# Patient Record
Sex: Female | Born: 1990 | Hispanic: No | Smoking: Never smoker
Health system: Southern US, Community
[De-identification: ages and names within clinical notes are randomized; demographics above are authoritative.]

## PROBLEM LIST (undated history)

## (undated) DIAGNOSIS — K4090 Unilateral inguinal hernia, without obstruction or gangrene, not specified as recurrent: Secondary | ICD-10-CM

## (undated) DIAGNOSIS — S43006A Unspecified dislocation of unspecified shoulder joint, initial encounter: Secondary | ICD-10-CM

## (undated) DIAGNOSIS — N83209 Unspecified ovarian cyst, unspecified side: Secondary | ICD-10-CM

## (undated) HISTORY — DX: Unilateral inguinal hernia, without obstruction or gangrene, not specified as recurrent: K40.90

## (undated) HISTORY — DX: Unspecified ovarian cyst, unspecified side: N83.209

---
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
## 2011-01-31 ENCOUNTER — Encounter: Payer: Self-pay | Admitting: Physician Assistant

## 2012-04-10 ENCOUNTER — Encounter: Payer: BC Managed Care – PPO | Admitting: Obstetrics and Gynecology

## 2015-10-16 ENCOUNTER — Other Ambulatory Visit: Payer: Self-pay | Admitting: Emergency Medicine

## 2015-10-16 DIAGNOSIS — N83292 Other ovarian cyst, left side: Secondary | ICD-10-CM

## 2015-10-26 ENCOUNTER — Inpatient Hospital Stay: Admission: RE | Admit: 2015-10-26 | Payer: Self-pay | Source: Ambulatory Visit

## 2015-11-02 ENCOUNTER — Ambulatory Visit
Admission: RE | Admit: 2015-11-02 | Discharge: 2015-11-02 | Disposition: A | Discharge status: Home / Self Care | Payer: BLUE CROSS/BLUE SHIELD | Source: Ambulatory Visit | Attending: Emergency Medicine | Admitting: Emergency Medicine

## 2015-11-02 DIAGNOSIS — N83292 Other ovarian cyst, left side: Secondary | ICD-10-CM

## 2015-11-02 MED ORDER — IOPAMIDOL (ISOVUE-300) INJECTION 61%
100.0000 mL | Freq: Once | INTRAVENOUS | Status: AC | PRN
  Administered 2015-11-02: 100 mL via INTRAVENOUS

## 2017-04-16 ENCOUNTER — Emergency Department (HOSPITAL_BASED_OUTPATIENT_CLINIC_OR_DEPARTMENT_OTHER)
Admission: EM | Admit: 2017-04-16 | Discharge: 2017-04-16 | Disposition: A | Discharge status: Home / Self Care | Payer: BLUE CROSS/BLUE SHIELD | Attending: Physician Assistant | Admitting: Physician Assistant

## 2017-04-16 ENCOUNTER — Encounter (HOSPITAL_BASED_OUTPATIENT_CLINIC_OR_DEPARTMENT_OTHER): Payer: Self-pay

## 2017-04-16 DIAGNOSIS — S43005A Unspecified dislocation of left shoulder joint, initial encounter: Secondary | ICD-10-CM | POA: Insufficient documentation

## 2017-04-16 DIAGNOSIS — X58XXXA Exposure to other specified factors, initial encounter: Secondary | ICD-10-CM | POA: Diagnosis not present

## 2017-04-16 DIAGNOSIS — Y929 Unspecified place or not applicable: Secondary | ICD-10-CM | POA: Insufficient documentation

## 2017-04-16 DIAGNOSIS — Y9389 Activity, other specified: Secondary | ICD-10-CM | POA: Diagnosis not present

## 2017-04-16 DIAGNOSIS — F17200 Nicotine dependence, unspecified, uncomplicated: Secondary | ICD-10-CM | POA: Insufficient documentation

## 2017-04-16 DIAGNOSIS — S4992XA Unspecified injury of left shoulder and upper arm, initial encounter: Secondary | ICD-10-CM | POA: Diagnosis present

## 2017-04-16 DIAGNOSIS — Y990 Civilian activity done for income or pay: Secondary | ICD-10-CM | POA: Insufficient documentation

## 2017-04-16 HISTORY — DX: Unspecified dislocation of unspecified shoulder joint, initial encounter: S43.006A

## 2017-04-16 MED ORDER — FENTANYL CITRATE (PF) 100 MCG/2ML IJ SOLN
50.0000 ug | Freq: Once | INTRAMUSCULAR | Status: AC
  Administered 2017-04-16: 50 ug via NASAL
  Filled 2017-04-16: qty 2

## 2017-04-16 MED ORDER — ONDANSETRON HCL 4 MG/2ML IJ SOLN
4.0000 mg | Freq: Once | INTRAMUSCULAR | Status: DC
  Filled 2017-04-16: qty 2

## 2017-04-16 MED ORDER — LIDOCAINE HCL (PF) 1 % IJ SOLN
30.0000 mL | Freq: Once | INTRAMUSCULAR | Status: AC
  Administered 2017-04-16: 30 mL via INTRADERMAL
  Filled 2017-04-16: qty 30

## 2017-04-16 MED ORDER — MORPHINE SULFATE (PF) 4 MG/ML IV SOLN
4.0000 mg | Freq: Once | INTRAVENOUS | Status: DC
  Filled 2017-04-16: qty 1

## 2017-04-16 MED ORDER — ONDANSETRON 4 MG PO TBDP
ORAL_TABLET | ORAL | Status: AC
  Filled 2017-04-16: qty 1

## 2017-04-16 MED ORDER — ONDANSETRON 4 MG PO TBDP
4.0000 mg | ORAL_TABLET | Freq: Once | ORAL | Status: AC
  Administered 2017-04-16: 4 mg via ORAL
  Filled 2017-04-16: qty 1

## 2017-04-16 NOTE — ED Notes (Signed)
ED Provider at bedside. 

## 2017-04-16 NOTE — ED Triage Notes (Signed)
C/o pain to left shoulder from pushing door into work-hx of shoulder dislocation-pt has swath in place-stats EMS placed-slow steady gait

## 2017-04-16 NOTE — ED Provider Notes (Signed)
[facility] Provider Note   CSN: [id number] Arrival date & time: 04/16/17  1634  By signing my name below, I, Rosana Fret, attest that this documentation has been prepared under the direction and in the presence of Berkleigh Beckles, Cindee Salt, MD. Electronically Signed: Rosana Fret, ED Scribe. 04/16/17. 6:18 PM.   History   Chief Complaint Chief Complaint  Patient presents with  . Shoulder Injury    HPI HPI Comments: Cheryl Estrada is a 26 y.o. female with a PMHx of left shoulder dislocation who presents to the Emergency Department complaining of sudden onset, moderate left shoulder pain onset just prior to arrival. Pt states she was at work when she pushed open a door and hurt her shoulder. Pt states pain is exacerbated by movement. No treatments tried prior to arrival in the ED. Pt denies numbness, tingling, or any other complaints at this time.  Past Medical History:  Diagnosis Date  . Shoulder dislocation     There are no active problems to display for this patient.   History reviewed. No pertinent surgical history.  OB History    No data available       Home Medications    Prior to Admission medications   Not on File    Family History No family history on file.  Social History Social History  Substance Use Topics  . Smoking status: Current Some Day Smoker  . Smokeless tobacco: Never Used  . Alcohol use Yes     Comment: occ     Allergies   Doxycycline   Review of Systems Review of Systems  Musculoskeletal: Positive for arthralgias and myalgias.  Neurological: Negative for numbness.  All other systems reviewed and are negative.    Physical Exam Updated Vital Signs BP (!) 108/56 (BP Location: Right Arm)   Pulse 79   Temp 98.2 F (36.8 C) (Oral)   Resp 20   Ht 5\' 4"  (1.626 m)   Wt 101 lb (45.8 kg)   LMP 04/14/2017   SpO2 100%   BMI 17.34 kg/m   Physical Exam  Constitutional: She is oriented to person, place, and  time. She appears well-developed and well-nourished.  HENT:  Head: Normocephalic and atraumatic.  Cardiovascular: Normal rate, regular rhythm and normal heart sounds.  Exam reveals no gallop and no friction rub.   No murmur heard. Pulmonary/Chest: Effort normal and breath sounds normal. No respiratory distress. She has no wheezes.  Musculoskeletal: She exhibits deformity.  Left shoulder deformity. Distal sensation and strength intact. ROM limited due to pain.   Neurological: She is alert and oriented to person, place, and time.  Skin: Skin is warm and dry.  Psychiatric: She has a normal mood and affect.  Nursing note and vitals reviewed.    ED Treatments / Results  DIAGNOSTIC STUDIES: Oxygen Saturation is 100% on RA, normal by my interpretation.   COORDINATION OF CARE: 6:14PM-Discussed next steps with pt including pain management with OTC medicine, rest and ice at home. Pt verbalized understanding and is agreeable with the plan.   Labs (all labs ordered are listed, but only abnormal results are displayed) Labs Reviewed - No data to display  EKG  EKG Interpretation None       Radiology No results found.  Procedures Procedures (including critical care time)  Medications Ordered in ED Medications  morphine 4 MG/ML injection 4 mg (not administered)  ondansetron (ZOFRAN) injection 4 mg (not administered)  ondansetron (ZOFRAN-ODT) 4 MG disintegrating tablet (not administered)  lidocaine (  PF) (XYLOCAINE) 1 % injection 30 mL (30 mLs Intradermal Given 04/16/17 1744)  ondansetron (ZOFRAN-ODT) disintegrating tablet 4 mg (4 mg Oral Given 04/16/17 1744)  fentaNYL (SUBLIMAZE) injection 50 mcg (50 mcg Nasal Given 04/16/17 1740)     Initial Impression / Assessment and Plan / ED Course  I have reviewed the triage vital signs and the nursing notes.  Pertinent labs & imaging results that were available during my care of the patient were reviewed by me and considered in my medical  decision making (see chart for details).    I personally performed the services described in this documentation, which was scribed in my presence. The recorded information has been reviewed and is accurate.    Patient is a very pleasant 26 year old female who has a shoulder dislocation of the left. Patient was pushing the door open when she her shoulder dislocated. Usually she can relocate it without an issue. This happened several times in the past. This time she was unable to. On arrival patient was anxious, having carpal spasms. Unable to get IV. Given intranasal. Patient felt much better. Patient's shoulder [facility].  We'll have her follow with orthopedics.    Reduction of dislocation Date/Time: 6:21 PM Performed by: Arlana Hoveourteney L Alinda Egolf Authorized by: Arlana Hoveourteney L Pamela Intrieri Consent: Verbal consent obtained. Risks and benefits: risks, benefits and alternatives were discussed Consent given by: patient Required items: required blood products, implants, devices, and special equipment available Time out: Immediately prior to procedure a "time out" was called to verify the correct patient, procedure, equipment, support staff and site/side marked as required.  Patient sedated: , intranasal fentyl  Vitals: Vital signs were monitored during sedation. Patient tolerance: Patient tolerated the procedure well with no immediate complications. Joint: L shoulder Reduction technique: Tomasa Randunningham    Final Clinical Impressions(s) / ED Diagnoses   Final diagnoses:  None    New Prescriptions New Prescriptions   No medications on file     Abelino DerrickMackuen, Charde Macfarlane Lyn, MD 04/16/17 Rickey [id number]
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
## 2020-03-23 ENCOUNTER — Encounter: Payer: Self-pay | Admitting: Certified Nurse Midwife

## 2020-03-23 ENCOUNTER — Other Ambulatory Visit: Payer: Self-pay

## 2020-03-23 ENCOUNTER — Other Ambulatory Visit (HOSPITAL_COMMUNITY)
Admission: RE | Admit: 2020-03-23 | Discharge: 2020-03-23 | Disposition: A | Discharge status: Home / Self Care | Payer: No Typology Code available for payment source | Source: Ambulatory Visit | Attending: Certified Nurse Midwife | Admitting: Certified Nurse Midwife

## 2020-03-23 ENCOUNTER — Ambulatory Visit (INDEPENDENT_AMBULATORY_CARE_PROVIDER_SITE_OTHER): Payer: PRIVATE HEALTH INSURANCE | Admitting: Certified Nurse Midwife

## 2020-03-23 VITALS — BP 124/78 | HR 114 | Ht 64.0 in | Wt 109.0 lb

## 2020-03-23 DIAGNOSIS — Z113 Encounter for screening for infections with a predominantly sexual mode of transmission: Secondary | ICD-10-CM | POA: Diagnosis not present

## 2020-03-23 DIAGNOSIS — N6009 Solitary cyst of unspecified breast: Secondary | ICD-10-CM | POA: Diagnosis not present

## 2020-03-23 DIAGNOSIS — Z01419 Encounter for gynecological examination (general) (routine) without abnormal findings: Secondary | ICD-10-CM | POA: Diagnosis not present

## 2020-03-23 DIAGNOSIS — K4090 Unilateral inguinal hernia, without obstruction or gangrene, not specified as recurrent: Secondary | ICD-10-CM | POA: Diagnosis not present

## 2020-03-23 DIAGNOSIS — A549 Gonococcal infection, unspecified: Secondary | ICD-10-CM

## 2020-03-23 DIAGNOSIS — A5901 Trichomonal vulvovaginitis: Secondary | ICD-10-CM

## 2020-03-23 NOTE — Progress Notes (Signed)
New patient in the office for annual. Pt is unsure of last pap Pt has concerns about lump in left breast and a hernia in the left pelvic area. Desires full std panel, declines BC GAD-7= 6

## 2020-03-23 NOTE — Progress Notes (Addendum)
GYNECOLOGY ANNUAL PREVENTATIVE CARE ENCOUNTER NOTE  History:     Cheryl Estrada is a 29 y.o. No obstetric history on file. female here for a routine annual gynecologic exam.  Current complaints:   Starting 2-3 months ago the pt reports she began bleeding after penetration during intercourse. Bleeding lasted 1-2 weeks. She says it was heavier than her menstrual period but was not painful or accompanied by other symtpoms. It has not happened after other activities. Pt denies any GU changes including urinary urgency, smell, color, or sensation.  Pt reports she felt a mass in her left breast that appeared two months ago. The mass is not tender and she has observed no changes with menstruation. There is no discharge.    She also reports a right ovarian cyst that was first discovered over 10 years ago. The cyst is asymptomatic and is not painful.  Pt reports a left-sided inguinal hernia that developed in 2016. She is unsure whether it has grown in size but reports it is asymptomatic.  Denies abnormal vaginal discharge, pelvic pain, or other gynecologic concerns.     Gynecologic History Patient's last menstrual period was 03/02/2020. Contraception: none Last Pap: Pt reports it was possibly 2015. Results were: normal with negative HPV  Obstetric History OB History  No obstetric history on file.    Past Medical History:  Diagnosis Date  . Shoulder dislocation     History reviewed. No pertinent surgical history.  No current outpatient medications on file prior to visit.   No current facility-administered medications on file prior to visit.    Allergies  Allergen Reactions  . Doxycycline Swelling    Social History:  reports that she has been smoking. She has never used smokeless tobacco. She reports current alcohol use. She reports current drug use. Drug: Marijuana. Household: Pt and partner. Pt reports she feels safe at home. 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   Family History  Problem Relation Age of Onset  . Breast cancer Maternal Grandmother   . Breast cancer Cousin     The following portions of the patient's history were reviewed and updated as appropriate: allergies, current medications, past family history, past medical history, past social history, past surgical history and problem list.  Review of Systems Pertinent items noted in HPI and remainder of comprehensive ROS otherwise negative.  Physical Exam:  BP 124/78   Pulse (!) 114   Ht 5\' 4"  (1.626 m)   Wt 109 lb (49.4 kg)   LMP 03/02/2020   BMI 18.71 kg/m  CONSTITUTIONAL: Well-developed, well-nourished female in no acute distress.  HENT:  Normocephalic, atraumatic, External right and left ear normal. Oropharynx is clear and moist EYES: Conjunctivae and EOM are normal. Pupils are equal, round, and reactive to light. No scleral icterus.  NECK: Normal range of motion, supple, no masses.  Normal thyroid.  SKIN: Skin is warm and dry. No rash noted. Not diaphoretic. No erythema. No pallor. MUSCULOSKELETAL: Normal range of motion. No tenderness.  No cyanosis, clubbing, or edema.  2+ distal pulses. NEUROLOGIC: Alert and oriented to person, place, and time. Normal reflexes, muscle tone coordination.  PSYCHIATRIC: Normal mood and affect. Normal behavior. Normal judgment and thought content. CARDIOVASCULAR: Normal heart rate noted, regular rhythm RESPIRATORY: Clear to auscultation bilaterally. Effort and breath sounds normal, no problems with respiration noted. BREASTS: Symmetric in size. Performed by 05/02/2020 CNM, please see her note for more detail. Bilateral masses noted. Right mass on the lateral breast  side. Left mass located lateral and inferior to nipple.  ABDOMEN: Soft, no distention noted.  No tenderness, rebound or guarding.  PELVIC: Normal appearing external genitalia and urethral meatus; Left inguinal hernia noted, normal appearing vaginal mucosa and cervix.  No  abnormal discharge noted.  Pap smear obtained by Darrol Poke CNM, please see her note for additional detail.   Assessment and Plan:    1. Encounter for annual routine gynecological examination - Pt reporting to establish care and annual. Reports no health concerns except for those listed.  - Cytology - PAP( [place])  2. Screening for STDs (sexually transmitted diseases) - Cervicovaginal ancillary only( [place]) - HIV antibody (with reflex) - RPR - Hepatitis C Antibody - Hepatitis B Surface AntiGEN  3. Cystic lump of breast - Bilateral masses observed:right lateral breast, and left lateral, inferior by the nipple.  - Pt referred for Mammary U/S for additional imaging and dx.  4. Inguinal hernia of left side without obstruction or gangrene - Pt referred to general surgery for tx.   Will follow up results of pap smear and manage accordingly. Mammogram scheduled Routine preventative health maintenance measures emphasized. Please refer to After Visit Summary for other counseling recommendations.      Shawna Orleans, [employer]  Attestation of Supervision of Student:  I confirm that I have verified the information documented in the medical student's note and that I have also personally reperformed the history, physical exam and all medical decision making activities.  I have verified that all services and findings are accurately documented in this student's note; and I agree with management and plan as outlined in the documentation. I have also made any necessary editorial changes.  Physical Exam:  BP 124/78   Pulse (!) 114   Ht 5\' 4"  (1.626 m)   Wt 109 lb (49.4 kg)   LMP 03/02/2020   BMI 18.71 kg/m  CONSTITUTIONAL: Well-developed, well-nourished female in no acute distress.  HENT:  Normocephalic, atraumatic, External right and left ear normal. Oropharynx is clear and moist EYES: Conjunctivae and EOM are normal. Pupils are equal, round, and reactive to  light. NECK: Normal range of motion, supple, no masses.  Normal thyroid.  SKIN: Skin is warm and dry. No rash noted. Not diaphoretic. No erythema. No pallor. MUSCULOSKELETAL: Normal range of motion. No tenderness.  No cyanosis, clubbing, or edema.  2+ distal pulses. NEUROLOGIC: Alert and oriented to person, place, and time. Normal reflexes, muscle tone coordination.  PSYCHIATRIC: Normal mood and affect. Normal behavior. Normal judgment and thought content. CARDIOVASCULAR: Normal heart rate noted, regular rhythm RESPIRATORY: Clear to auscultation bilaterally. Effort and breath sounds normal, no problems with respiration noted. Physical Exam  Pulmonary/Chest:     BREASTS: Symmetric in size. 1cm cystic mass palpated at 11o'clock bilaterally, left breast mass closer to breast bone and right breast closer to nipple, no tenderness with palpation, cyst mobile and round, symmetrical in size. Performed in the presence of a chaperone. ABDOMEN: Soft, no distention noted.  No tenderness, rebound or guarding.  PELVIC: Normal appearing external genitalia and urethral meatus; normal appearing vaginal mucosa and cervix. Anterior cervix. Cervix friable with obtaining pap. No abnormal discharge noted.  Pap smear obtained.  Normal uterine size, no uterine or adnexal tenderness. Raised inguinal hernia of left groin, tender to touch, easily reducible.   Performed in the presence of a chaperone.     Referral to general surgery for inguinal hernia  [place] scheduled for evaluation of breast  cyst      Sharyon Cable, CNM [facility] [employer], [facility] 03/23/2020 10:08 PM

## 2020-03-24 LAB — RPR: RPR Ser Ql: NONREACTIVE

## 2020-03-24 LAB — CERVICOVAGINAL ANCILLARY ONLY
Chlamydia: NEGATIVE
Comment: NEGATIVE
Comment: NEGATIVE
Comment: NORMAL
Neisseria Gonorrhea: POSITIVE — AB
Trichomonas: POSITIVE — AB

## 2020-03-24 LAB — HEPATITIS B SURFACE ANTIGEN: Hepatitis B Surface Ag: NEGATIVE

## 2020-03-24 LAB — HEPATITIS C ANTIBODY: Hep C Virus Ab: <0.1 s/co ratio (ref 0.0–0.9)

## 2020-03-24 LAB — HIV ANTIBODY (ROUTINE TESTING W REFLEX): HIV Screen 4th Generation wRfx: NONREACTIVE

## 2020-03-27 ENCOUNTER — Ambulatory Visit (INDEPENDENT_AMBULATORY_CARE_PROVIDER_SITE_OTHER): Payer: PRIVATE HEALTH INSURANCE

## 2020-03-27 ENCOUNTER — Other Ambulatory Visit: Payer: Self-pay

## 2020-03-27 VITALS — BP 109/73 | HR 77 | Ht 64.0 in | Wt 116.0 lb

## 2020-03-27 DIAGNOSIS — A549 Gonococcal infection, unspecified: Secondary | ICD-10-CM | POA: Diagnosis not present

## 2020-03-27 MED ORDER — TINIDAZOLE 500 MG PO TABS: 2.0000 g | ORAL_TABLET | Freq: Once | ORAL | 0 refills | Status: AC

## 2020-03-27 MED ORDER — CEFTRIAXONE SODIUM 500 MG IJ SOLR
500.0000 mg | Freq: Once | INTRAMUSCULAR | Status: AC
  Administered 2020-03-27: 15:00:00 500 mg via INTRAMUSCULAR

## 2020-03-27 NOTE — Progress Notes (Addendum)
Presents for Rocephin Injection, given in LUOQ, tolerated well.  Patient advised that her partner needs to be treated and that she abstain from sexual intercourse for 2 weeks after treated is completed, no alcohol, she verbalized understanding and agreement.  Administrations This Visit    cefTRIAXone (ROCEPHIN) injection 500 mg    Admin Date 03/27/2020 Action Given Dose 500 mg Route Intramuscular Administered By Maretta Bees, RMA          PLAN Return for TOC  In 4-8 weeks

## 2020-03-27 NOTE — Addendum Note (Signed)
Addended by: Sharyon Cable on: 03/27/2020 09:52 AM   Modules accepted: Orders

## 2020-03-28 ENCOUNTER — Ambulatory Visit: Payer: Self-pay | Admitting: Surgery

## 2020-03-30 ENCOUNTER — Encounter: Payer: Self-pay | Admitting: Surgery

## 2020-03-30 ENCOUNTER — Ambulatory Visit (INDEPENDENT_AMBULATORY_CARE_PROVIDER_SITE_OTHER): Payer: PRIVATE HEALTH INSURANCE | Admitting: Surgery

## 2020-03-30 ENCOUNTER — Other Ambulatory Visit: Payer: Self-pay

## 2020-03-30 VITALS — BP 118/66 | HR 83 | Temp 97.7°F | Resp 12 | Ht 64.0 in | Wt 109.6 lb

## 2020-03-30 DIAGNOSIS — K4090 Unilateral inguinal hernia, without obstruction or gangrene, not specified as recurrent: Secondary | ICD-10-CM

## 2020-03-30 NOTE — Progress Notes (Signed)
Patient ID: Cheryl Estrada, female   DOB: Feb 18, 1991, 29 y.o.   MRN: [id number]  Chief Complaint: Left inguinal hernia  History of Present Illness Cheryl Estrada is a 29 y.o. female with a 6 to 7-year history of left groin bulge.  She is noted progressive discomfort, associated increase in size.  She reports spontaneous reduction when recumbent.  She reports exacerbation during times of exertion, lifting and coughing.  She remains quite athletic and involved with women's tackle football team, and hopes to complete her season prior to having surgical intervention.  She denies any bowel habit changes or difficulty voiding.  Past Medical History Past Medical History:  Diagnosis Date  . Shoulder dislocation       History reviewed. No pertinent surgical history.  Allergies  Allergen Reactions  . Doxycycline Swelling    No current outpatient medications on file.   No current facility-administered medications for this visit.    Family History Family History  Problem Relation Age of Onset  . Breast cancer Maternal Grandmother   . Breast cancer Cousin   . Hypertension Mother       Social History Social History   Tobacco Use  . Smoking status: Current Some Day Smoker  . Smokeless tobacco: Never Used  Substance Use Topics  . Alcohol use: Yes    Comment: occ  . Drug use: Yes    Types: Marijuana        Review of Systems  Constitutional: Negative.   HENT: Negative.   Eyes: Negative.   Respiratory: Negative.   Cardiovascular: Negative.   Gastrointestinal: Negative.   Genitourinary: Negative.   Musculoskeletal: Negative.   Skin: Negative.   Neurological: Negative.   Endo/Heme/Allergies: Negative.       Physical Exam Blood pressure 118/66, pulse 83, temperature 97.7 F (36.5 C), resp. rate 12, height 5\' 4"  (1.626 m), weight 109 lb 9.6 oz (49.7 kg), last menstrual period 03/02/2020, SpO2 99 %. Last Weight  Most recent update: 03/30/2020 11:47 AM   Weight  49.7 kg  (109 lb 9.6 oz)            CONSTITUTIONAL: Well developed, and nourished, appropriately responsive and aware without distress.   EYES: Sclera non-icteric.   EARS, NOSE, MOUTH AND THROAT: Mask worn.    Hearing is intact to voice.  NECK: Trachea is midline, and there is no jugular venous distension.  LYMPH NODES:  Lymph nodes in the neck are not enlarged. RESPIRATORY:  Lungs are clear, and breath sounds are equal bilaterally. Normal respiratory effort without pathologic use of accessory muscles. CARDIOVASCULAR: Heart is regular in rate and rhythm. GI: The abdomen is flat, soft, nontender, and nondistended. There were no palpable masses. I did not appreciate hepatosplenomegaly. There were normal bowel sounds. GU: Readily observable left groin bulge, changing with Valsalva and easily reducible.  No appreciable contralateral process. MUSCULOSKELETAL:  Symmetrical muscle tone appreciated in all four extremities.    SKIN: Skin turgor is normal. No pathologic skin lesions appreciated.  NEUROLOGIC:  Motor and sensation appear grossly normal.  Cranial nerves are grossly without defect. PSYCH:  Alert and oriented to person, place and time. Affect is appropriate for situation.  Data Reviewed I have personally reviewed what is currently available of the patient's imaging, recent labs and medical records.   Labs:  No flowsheet data found. No flowsheet data found.    Imaging:  Within last 24 hrs: No results found.  Assessment    Left inguinal hernia. Patient Active Problem  List   Diagnosis Date Noted  . Inguinal hernia of left side without obstruction or gangrene 03/23/2020    Plan    We discussed mesh placement, and scarring and her future potential for reproduction.  She is aware of the risk of an unplanned pregnancy, but is somewhat undecided in terms of planning future pregnancies. We discussed various options for surgical repair, the pros and cons of each. I recommended robotic left  inguinal hernia repair with mesh. In lieu of her upcoming season, and further decision making she would like to defer deciding on proceeding with surgery at present time.  She did allude to waiting till after the end of June. We will schedule a follow-up phone call in about a month to see where she is at.  Face-to-face time spent with the patient and accompanying care providers(if present) was 20 minutes, with more than 50% of the time spent counseling, educating, and coordinating care of the patient.      Campbell Lerner M.D., FACS 03/30/2020, 1:14 PM

## 2020-03-30 NOTE — Patient Instructions (Addendum)
We will follow up in one month.   If then you do decide to proceed with surgery. Our surgery scheduler will contact you to schedule your surgery. Please have the [facility] Sheet available when she calls you.  Call the office if you have any questions or concerns.   Femoral Hernia, Adult  Having a femoral hernia means that fat or part of the intestine has pushed through a weak area between muscles into an opening in the lower groin (femoral canal). A femoral hernia may be present at birth, but it may not cause symptoms until you are an adult. You may also develop a femoral hernia as you get older. A femoral hernia tends to get worse over time. If it is not treated, it will not go away. There are several types of femoral hernias. You may have:  A hernia that comes and goes (reducible hernia). You may be able to see it only when you strain, lift something heavy, or cough. This type of hernia can be pushed back into the abdomen (reduced).  A hernia that traps abdominal tissue inside the hernia (incarcerated hernia). This type of hernia cannot be reduced.  A hernia that cuts off blood flow to the tissues inside the hernia (strangulated hernia). Without blood supply, these tissues can start to die. This type of hernia requires emergency treatment. What are the causes? The cause is usually not known. This condition can be triggered by:  Coughing.  Suddenly straining the muscles of the abdomen.  Lifting heavy objects.  Straining to have a bowel movement. Constipation can lead to a femoral hernia. What increases the risk? You have a greater risk for a femoral hernia if you:  Are female.  Frequently lift heavy objects.  Smoke or have lung disease.  Are often constipated.  Strain to pass urine.  Are overweight. What are the signs or symptoms? In many cases, a femoral hernia does not cause symptoms. The most common symptom is a bulge in the upper thigh or groin. In women, the bulge may form  on the outside of the vagina instead. Other symptoms may include:  Mild pain or pressure.  Numbness. Symptoms of a strangulated femoral hernia include:  Sharp or increasing pain.  Nausea and vomiting.  Redness or darkening color of the hernia bulge. How is this diagnosed? This condition is diagnosed based on:  Your symptoms.  Your medical history.  A physical exam. You may be asked to cough or strain while standing. These actions increase the pressure inside your abdomen and force the hernia through the opening in your muscles. Your health care provider may try to reduce the hernia by pressing on it.  Imaging tests, such as: ? Ultrasound. ? CT scan. How is this treated? Surgery is the only treatment for a femoral hernia. A strangulated hernia requires emergency surgery. Follow these instructions at home: Activity  Return to your normal activities as told by your health care provider. Ask your health care provider what activities are safe for you.  Do not lift anything that is heavier than 10 lb (4.5 kg), or the limit that you are told, until your health care provider says that it is safe. Eating and drinking   Follow instructions from your health care provider about eating or drinking restrictions.  Eat more fiber to prevent constipation. Foods that contain fiber include fruits, vegetables, and whole grains.  Drink enough fluid to keep your urine pale yellow. This also helps to prevent constipation. General instructions  Take over-the-counter and prescription medicines only as told by your health care provider.  If you are overweight, work with your health care provider to safely lose weight.  Do not use any products that contain nicotine or tobacco, such as cigarettes and e-cigarettes. If you need help quitting, ask your health care provider.  Keep all follow-up visits as told by your health care provider. This is important. Contact a health care provider if:  Your  hernia becomes uncomfortable.  Your hernia gets larger and you cannot reduce it.  You are constipated. Signs of constipation include: ? Fewer bowel movements in a week than normal. ? Difficulty having a bowel movement. ? Stools that are dry, hard, or larger than normal.  You strain to pass urine. Get help right away if:  Your hernia suddenly becomes painful.  You have hernia pain that suddenly gets worse.  You have hernia pain along with any of the following: ? Chills. ? Fever. ? Nausea. ? Vomiting.  Your hernia bulge becomes dark, red, or painful to touch. Summary  Having a femoral hernia means that fat or part of the intestine has pushed through a weak area between muscles into an opening in the lower groin (femoral canal).  The most common sign of a femoral hernia is a bulge in the upper thigh or groin. In women, the bulge may form on the outside of the vagina instead.  Surgery is the only treatment for a femoral hernia. If this type of hernia is not treated, it will not go away. This information is not intended to replace advice given to you by your health care provider. Make sure you discuss any questions you have with your health care provider. Document Revised: 10/24/2017 Document Reviewed: 04/17/2017 Elsevier Patient Education  2020 [employer].

## 2020-03-31 NOTE — Progress Notes (Signed)
I reviewed the nurses note and agree with the plan of care.   Kieran Arreguin A, MD 02/20/2018 10:46 AM  

## 2020-03-31 NOTE — Progress Notes (Signed)
I reviewed the nurses note and agree with the plan of care.   Rhiley Tarver A, MD 02/20/2018 10:46 AM  

## 2020-04-03 ENCOUNTER — Other Ambulatory Visit: Payer: Self-pay

## 2020-04-03 ENCOUNTER — Ambulatory Visit
Admission: RE | Admit: 2020-04-03 | Discharge: 2020-04-03 | Disposition: A | Discharge status: Home / Self Care | Payer: No Typology Code available for payment source | Source: Ambulatory Visit | Attending: Certified Nurse Midwife | Admitting: Certified Nurse Midwife

## 2020-04-03 ENCOUNTER — Other Ambulatory Visit: Payer: Self-pay | Admitting: Certified Nurse Midwife

## 2020-04-03 ENCOUNTER — Ambulatory Visit
Admission: RE | Admit: 2020-04-03 | Discharge: 2020-04-03 | Disposition: A | Discharge status: Home / Self Care | Payer: PRIVATE HEALTH INSURANCE | Source: Ambulatory Visit | Attending: Certified Nurse Midwife | Admitting: Certified Nurse Midwife

## 2020-04-03 DIAGNOSIS — N6009 Solitary cyst of unspecified breast: Secondary | ICD-10-CM

## 2020-04-28 ENCOUNTER — Other Ambulatory Visit: Payer: Self-pay

## 2020-04-28 ENCOUNTER — Other Ambulatory Visit (HOSPITAL_COMMUNITY)
Admission: RE | Admit: 2020-04-28 | Discharge: 2020-04-28 | Disposition: A | Discharge status: Home / Self Care | Payer: PRIVATE HEALTH INSURANCE | Source: Ambulatory Visit | Attending: Obstetrics | Admitting: Obstetrics

## 2020-04-28 ENCOUNTER — Ambulatory Visit: Payer: PRIVATE HEALTH INSURANCE

## 2020-04-28 DIAGNOSIS — N898 Other specified noninflammatory disorders of vagina: Secondary | ICD-10-CM

## 2020-04-28 NOTE — Progress Notes (Signed)
Patient is in the office for test of cure. Pt states that she completed medication and denies abnormal symptoms.

## 2020-04-30 NOTE — Progress Notes (Signed)
Patient was assessed and managed by nursing staff during this encounter. I have reviewed the chart and agree with the documentation and plan. I have also made any necessary editorial changes.  Catalina Antigua, MD 04/30/2020 12:28 PM

## 2020-05-01 LAB — CERVICOVAGINAL ANCILLARY ONLY
Chlamydia: NEGATIVE
Comment: NEGATIVE
Comment: NEGATIVE
Comment: NORMAL
Neisseria Gonorrhea: NEGATIVE
Trichomonas: NEGATIVE

## 2020-05-02 ENCOUNTER — Other Ambulatory Visit: Payer: Self-pay

## 2020-05-02 ENCOUNTER — Telehealth (INDEPENDENT_AMBULATORY_CARE_PROVIDER_SITE_OTHER): Payer: PRIVATE HEALTH INSURANCE | Admitting: Surgery

## 2020-05-02 DIAGNOSIS — K4090 Unilateral inguinal hernia, without obstruction or gangrene, not specified as recurrent: Secondary | ICD-10-CM | POA: Diagnosis not present

## 2020-05-02 NOTE — Progress Notes (Signed)
I called Cheryl Estrada and she reported that she has not been troubled much by the presence of her hernia at her current level of activity.  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 She anticipates following up in about a month or so in consideration of getting her hernia repaired. She knows we remain readily available should she have needs prior to this.

## 2020-07-24 ENCOUNTER — Ambulatory Visit (HOSPITAL_COMMUNITY)
Admission: EM | Admit: 2020-07-24 | Discharge: 2020-07-24 | Disposition: A | Discharge status: Home / Self Care | Payer: PRIVATE HEALTH INSURANCE

## 2020-07-24 ENCOUNTER — Encounter (HOSPITAL_COMMUNITY): Payer: Self-pay | Admitting: Emergency Medicine

## 2020-07-24 ENCOUNTER — Other Ambulatory Visit: Payer: Self-pay

## 2020-07-24 DIAGNOSIS — K5900 Constipation, unspecified: Secondary | ICD-10-CM

## 2020-07-24 NOTE — ED Triage Notes (Signed)
Friday night (a week ago 07/14/2020) had taken robitussin and started drinking liquor  The next morning felt like stomach was hard and painful in middle of stomach.  After 3 days, took a laxative , had good results and felt better for 2 days.  After resuming a regular diet, pain has reoccurred, bloated.  Patient has noticed slight vaginal blood, questioned if blood was from rectum, patient feels pressure in rectum/vaginal area

## 2020-07-24 NOTE — Discharge Instructions (Addendum)
Recommend stool softener, push fluids.   Eat a diet high in fiber If you develop worsening pain, nausea or vomiting return here or Emergency Department.

## 2020-07-24 NOTE — ED Provider Notes (Signed)
[facility]    CSN: [id number] Arrival date & time: 07/24/20  1825      History   Chief Complaint Chief Complaint  Patient presents with  . Abdominal Pain    HPI Cheryl Estrada is a 29 y.o. female.   Pt reports one week ago she experienced abdominal pain with bloating.  Reports sx resolved after they took a laxative and had a bowel movement.  Pt reports intermittent episodes of bloating and discomfort since then.  Pt reports normally has a bowel movement every day, now not as frequent.  Reports normal bowel movement this morning. Denies dark stool or blood in stool.  Pt reports vaginal blood, LMP earlier this month, normal.  Denies nausea, vomiting, diarrhea, fever, chills, dysuria, hematuria.      Past Medical History:  Diagnosis Date  . Inguinal hernia   . Ovarian cyst   . Shoulder dislocation     Patient Active Problem List   Diagnosis Date Noted  . Inguinal hernia of left side without obstruction or gangrene 03/23/2020    History reviewed. No pertinent surgical history.  OB History   No obstetric history on file.      Home Medications    Prior to Admission medications   Not on File    Family History Family History  Problem Relation Age of Onset  . Breast cancer Maternal Grandmother   . Breast cancer Cousin   . Hypertension Mother     Social History Social History   Tobacco Use  . Smoking status: Never Smoker  . Smokeless tobacco: Never Used  Substance Use Topics  . Alcohol use: Yes    Comment: occ  . Drug use: Yes    Types: Marijuana     Allergies   Doxycycline   Review of Systems Review of Systems  Constitutional: Negative for chills and fever.  HENT: Negative for ear pain and sore throat.   Eyes: Negative for pain and visual disturbance.  Respiratory: Negative for cough and shortness of breath.   Cardiovascular: Negative for chest pain and palpitations.  Gastrointestinal: Positive for abdominal pain and  constipation. Negative for anal bleeding, blood in stool, diarrhea, nausea, rectal pain and vomiting.  Genitourinary: Positive for vaginal bleeding. Negative for dysuria, hematuria, pelvic pain, vaginal discharge and vaginal pain.  Musculoskeletal: Negative for arthralgias and back pain.  Skin: Negative for color change and rash.  Neurological: Negative for seizures and syncope.  All other systems reviewed and are negative.    Physical Exam Triage Vital Signs ED Triage Vitals  Enc Vitals Group     BP 07/24/20 1959 (!) 104/52     Pulse Rate 07/24/20 1959 79     Resp --      Temp 07/24/20 1959 99 F (37.2 C)     Temp Source 07/24/20 1959 Oral     SpO2 07/24/20 1959 100 %     Weight --      Height --      Head Circumference --      Peak Flow --      Pain Score 07/24/20 1955 5     Pain Loc --      Pain Edu? --      Excl. in GC? --    No data found.  Updated Vital Signs BP (!) 104/52 (BP Location: Right Arm)   Pulse 79   Temp 99 F (37.2 C) (Oral)   LMP 07/03/2020   SpO2 100%   Visual Acuity  Right Eye Distance:   Left Eye Distance:   Bilateral Distance:    Right Eye Near:   Left Eye Near:    Bilateral Near:     Physical Exam Vitals and nursing note reviewed.  Constitutional:      General: She is not in acute distress.    Appearance: She is well-developed.  HENT:     Head: Normocephalic and atraumatic.  Eyes:     Conjunctiva/sclera: Conjunctivae normal.  Cardiovascular:     Rate and Rhythm: Normal rate and regular rhythm.     Heart sounds: No murmur heard.   Pulmonary:     Effort: Pulmonary effort is normal. No respiratory distress.     Breath sounds: Normal breath sounds.  Abdominal:     Palpations: Abdomen is soft.     Tenderness: There is no abdominal tenderness. There is no right CVA tenderness, left CVA tenderness, guarding or rebound. Negative signs include Murphy's sign and McBurney's sign.     Hernia: No hernia is present.  Musculoskeletal:      Cervical back: Neck supple.  Skin:    General: Skin is warm and dry.  Neurological:     Mental Status: She is alert.      UC Treatments / Results  Labs (all labs ordered are listed, but only abnormal results are displayed) Labs Reviewed - No data to display  EKG   Radiology No results found.  Procedures Procedures (including critical care time)  Medications Ordered in UC Medications - No data to display  Initial Impression / Assessment and Plan / UC Course  I have reviewed the triage vital signs and the nursing notes.  Pertinent labs & imaging results that were available during my care of the patient were reviewed by me and considered in my medical decision making (see chart for details).     Benign abdominal exam, vitals WNL.  Pt has had some relief with laxative, bowel movements are not as frequent as baseline.  Recommend pt take stool softener and push fluids for constipation.  Return precautions discussed.   Final Clinical Impressions(s) / UC Diagnoses   Final diagnoses:  Constipation, unspecified constipation type     Discharge Instructions     Recommend stool softener, push fluids.   Eat a diet high in fiber If you develop worsening pain, nausea or vomiting return here or Emergency Department.    ED Prescriptions    None     PDMP not reviewed this encounter.   Jodell Cipro, PA-C 07/27/20 1423

## 2020-10-05 ENCOUNTER — Other Ambulatory Visit: Payer: PRIVATE HEALTH INSURANCE

## 2021-03-21 IMAGING — US US BREAST*R* LIMITED INC AXILLA
1 series · 4 of 4 positions shown · non-contrast
Comparison: None.

CLINICAL DATA: Palpable abnormality in the LEFT breast, found by
the patient. On physical exam, there is also question of an
abnormality in the UPPER-OUTER of the RIGHT.

EXAM:
ULTRASOUND OF THE BILATERAL BREAST

[Series 1: us breast*right* limited inc axilla · 0.06mm/px · 4 of 4 slices shown]
[im 1/4]
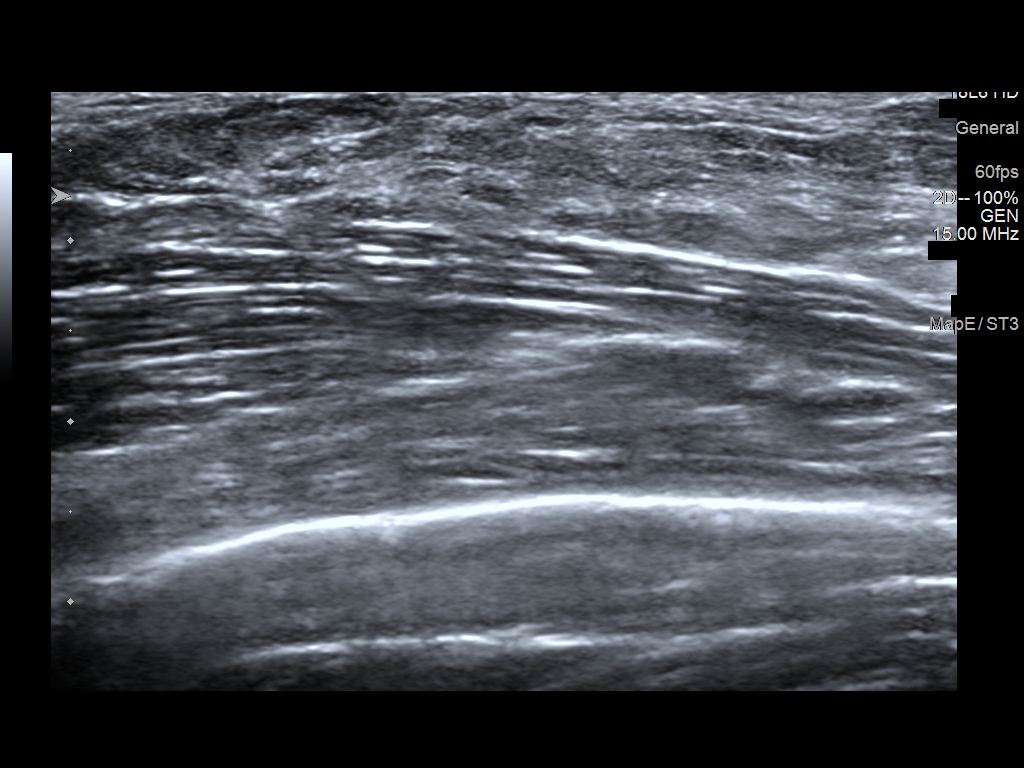
[im 2/4]
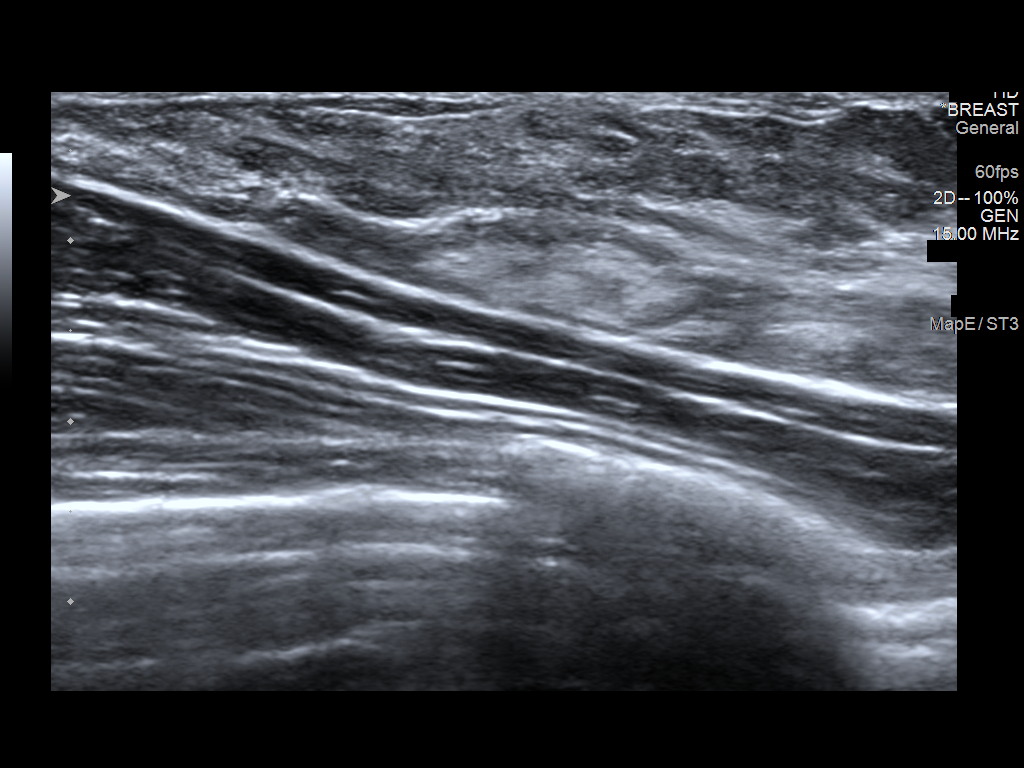
[im 3/4]
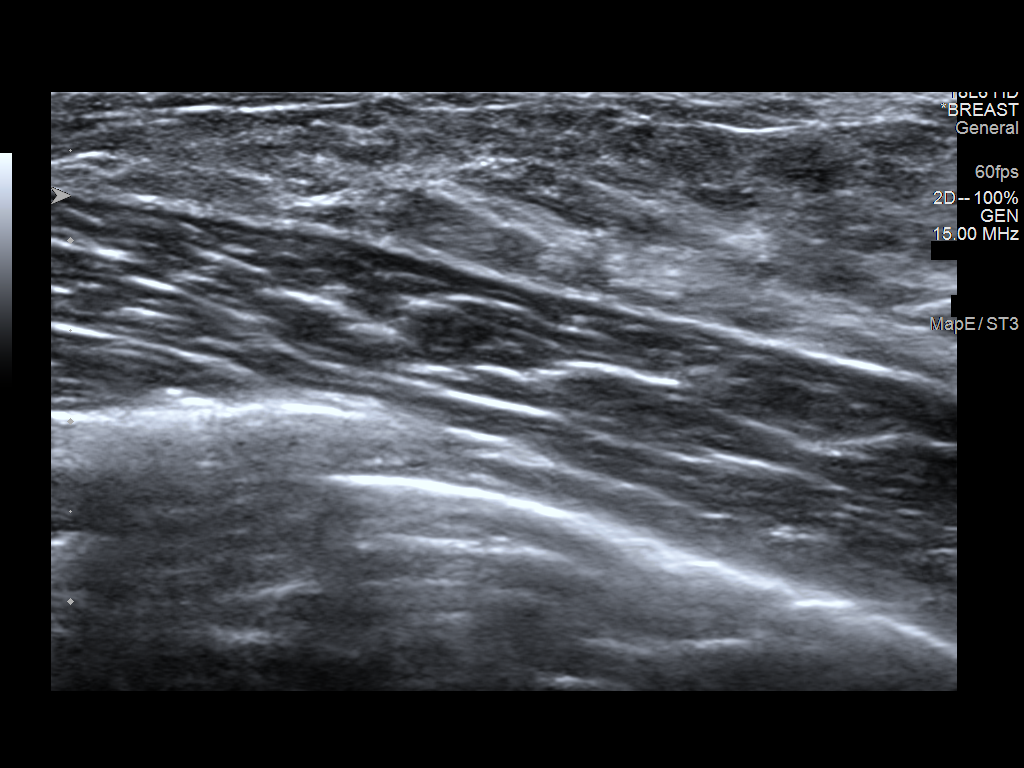
[im 4/4]
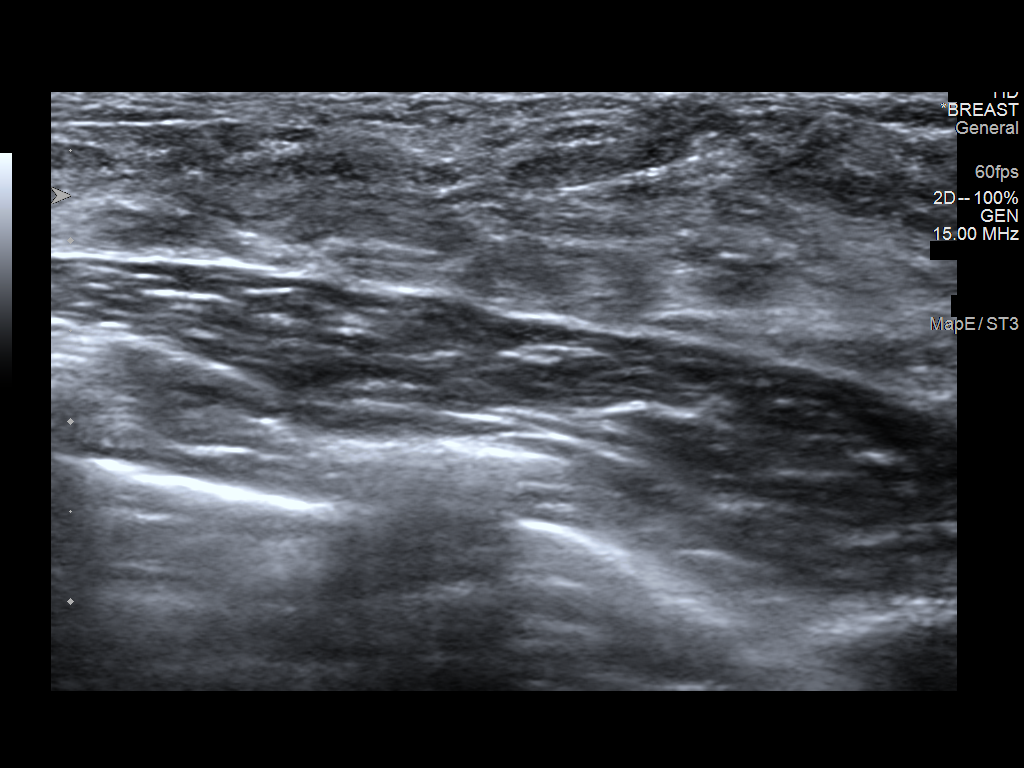

[4 of 4 positions shown; findings below may reference images not displayed]

FINDINGS: RIGHT breast:

Targeted ultrasound is performed, showing normal appearing
fibroglandular tissue in the UPPER-OUTER QUADRANT of the RIGHT
breast. No suspicious mass, distortion, or acoustic shadowing is
demonstrated with ultrasound.

LEFT breast:

On physical exam, I palpate 2 discrete adjacent masses in the 10
o'clock and 11 o'clock locations of the LEFT breast, 1 of which
corresponds to the area of patient's concern.

Targeted ultrasound is performed, showing a hypoechoic circumscribed
oval parallel mass in the 11 o'clock location of the LEFT breast 6
centimeters from the nipple measuring 0.9 x 0.5 x 0.9 centimeters.

A similar-appearing oval circumscribed mass is identified in the 10
o'clock location 6 centimeters from nipple measuring 0.9 x
cm.
IMPRESSION: Adjacent probably benign masses in the UPPER-OUTER QUADRANT of the
LEFT breast. Findings favor benign fibroadenomas. We discussed
management options including excision, biopsy, and close follow-up.
Imaging followup is recommended at 6, 12, and 24 months to assess
stability. The patient concurs with this plan.

No sonographic abnormality in the UPPER OUTER QUADRANT of the RIGHT
breast.

RECOMMENDATION:
Recommend LEFT breast ultrasound in 6 months to assess stability.

I have discussed the findings and recommendations with the patient.
If applicable, a reminder letter will be sent to the patient
regarding the next appointment.

BI-RADS CATEGORY  3: Probably benign.

## 2021-03-21 IMAGING — US US BREAST*L* LIMITED INC AXILLA
1 series · 11 of 11 positions shown · non-contrast
Comparison: None.

CLINICAL DATA: Palpable abnormality in the LEFT breast, found by
the patient. On physical exam, there is also question of an
abnormality in the UPPER-OUTER of the RIGHT.

EXAM:
ULTRASOUND OF THE BILATERAL BREAST

[Series 1: us breast*left* limited inc axilla · 0.06mm/px · 11 of 11 slices shown]
[im 1/11]
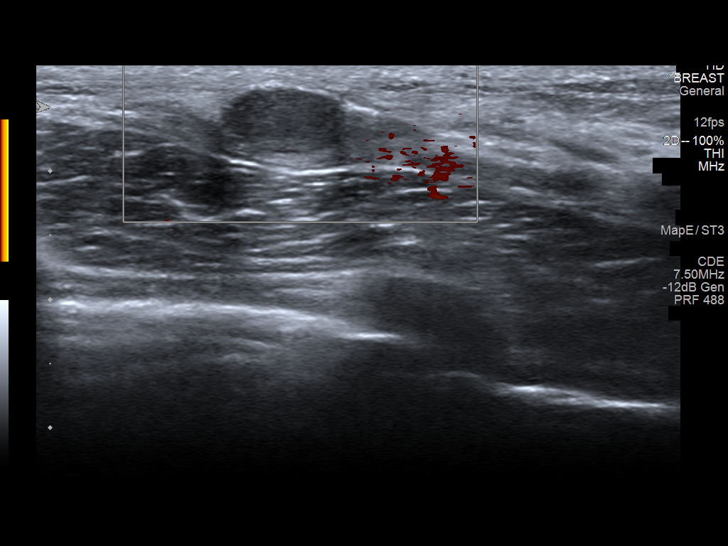
[im 2/11]
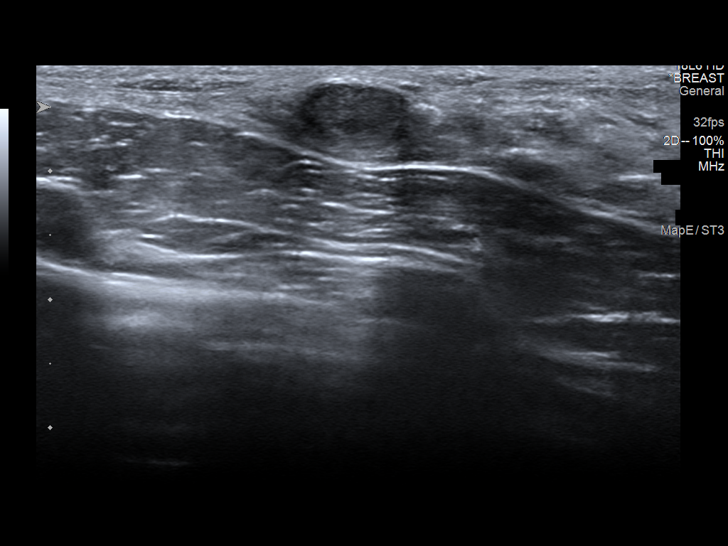
[im 3/11]
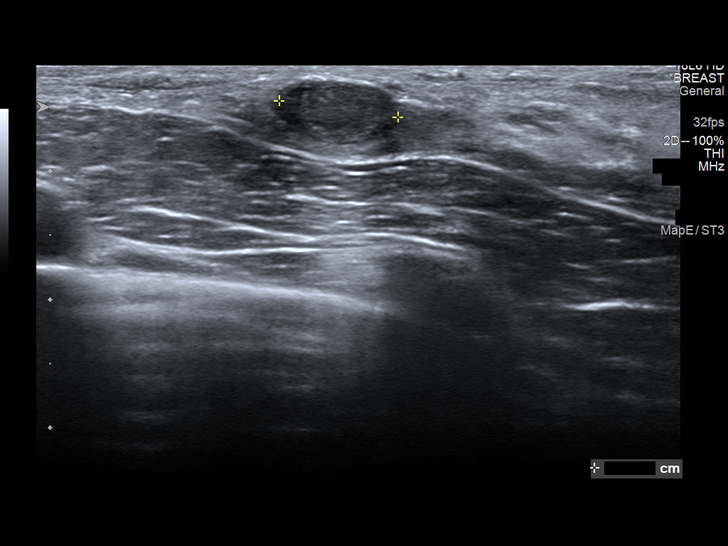
[im 4/11]
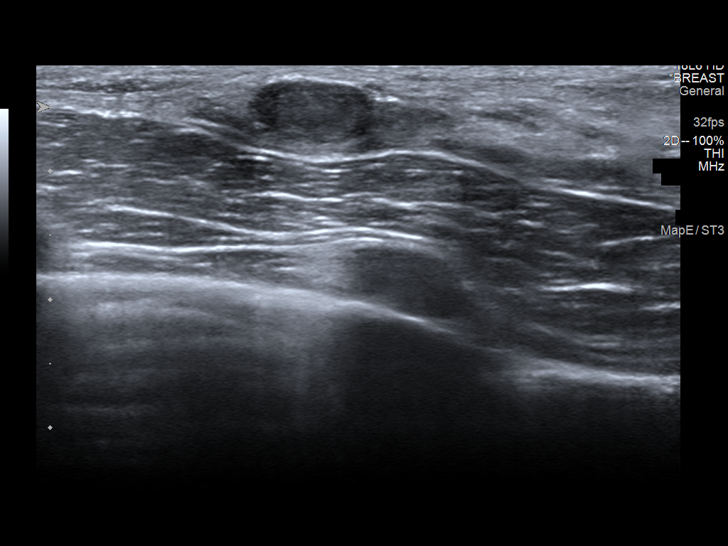
[im 5/11]
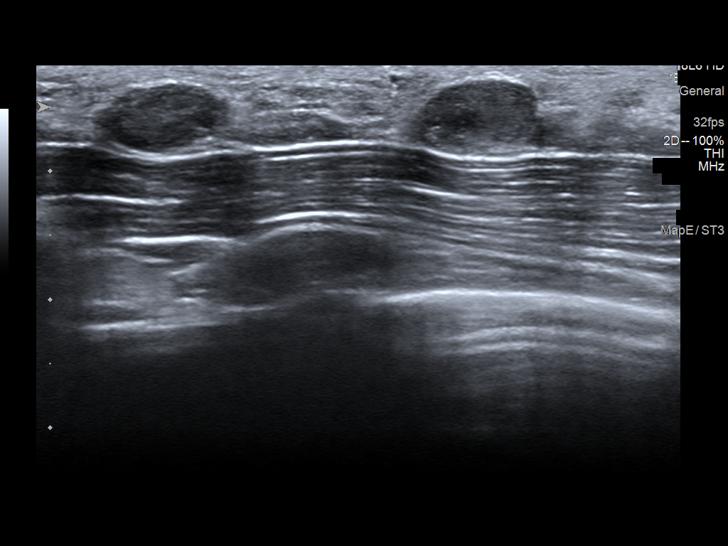
[im 6/11]
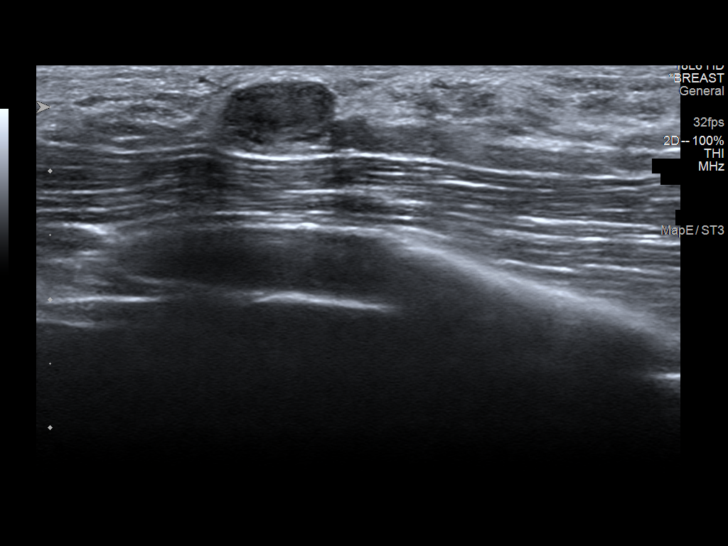
[im 7/11]
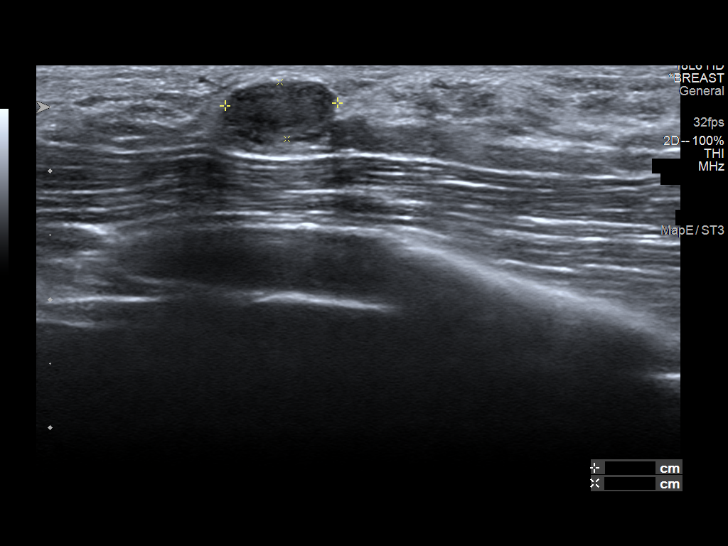
[im 8/11]
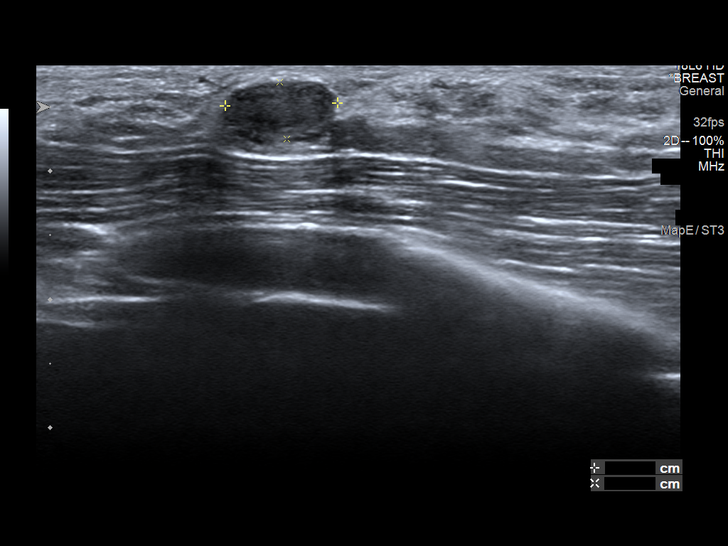
[im 9/11]
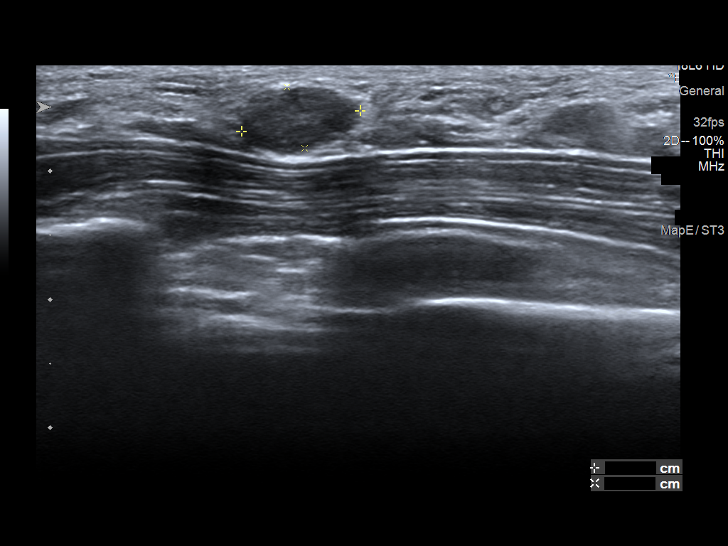
[im 10/11]
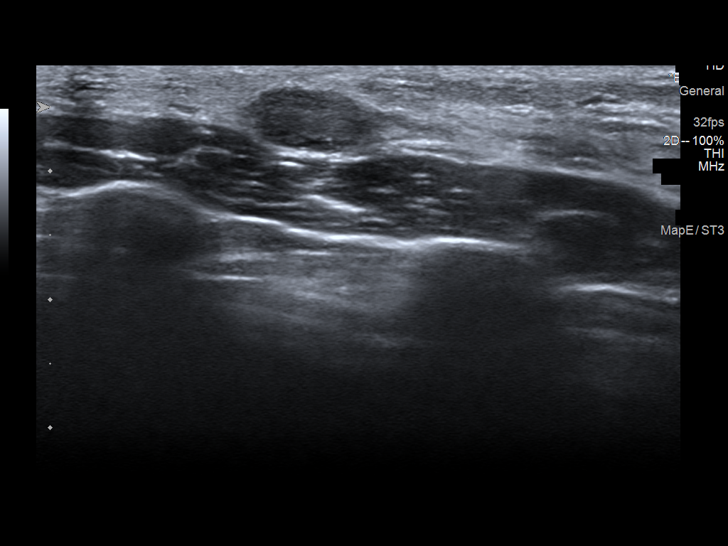
[im 11/11]
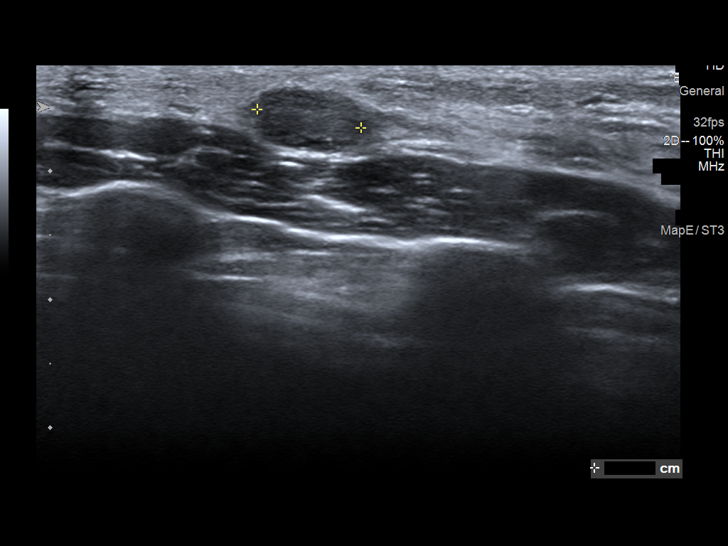

[11 of 11 positions shown; findings below may reference images not displayed]

FINDINGS: RIGHT breast:

Targeted ultrasound is performed, showing normal appearing
fibroglandular tissue in the UPPER-OUTER QUADRANT of the RIGHT
breast. No suspicious mass, distortion, or acoustic shadowing is
demonstrated with ultrasound.

LEFT breast:

On physical exam, I palpate 2 discrete adjacent masses in the 10
o'clock and 11 o'clock locations of the LEFT breast, 1 of which
corresponds to the area of patient's concern.

Targeted ultrasound is performed, showing a hypoechoic circumscribed
oval parallel mass in the 11 o'clock location of the LEFT breast 6
centimeters from the nipple measuring 0.9 x 0.5 x 0.9 centimeters.

A similar-appearing oval circumscribed mass is identified in the 10
o'clock location 6 centimeters from nipple measuring 0.9 x
cm.
IMPRESSION: Adjacent probably benign masses in the UPPER-OUTER QUADRANT of the
LEFT breast. Findings favor benign fibroadenomas. We discussed
management options including excision, biopsy, and close follow-up.
Imaging followup is recommended at 6, 12, and 24 months to assess
stability. The patient concurs with this plan.

No sonographic abnormality in the UPPER OUTER QUADRANT of the RIGHT
breast.

RECOMMENDATION:
Recommend LEFT breast ultrasound in 6 months to assess stability.

I have discussed the findings and recommendations with the patient.
If applicable, a reminder letter will be sent to the patient
regarding the next appointment.

BI-RADS CATEGORY  3: Probably benign.
